# Patient Record
Sex: Male | Born: 1965 | Race: White | Hispanic: No | Marital: Single
Health system: Southern US, Community
[De-identification: ages and names within clinical notes are randomized; demographics above are authoritative.]

---

## 2020-12-20 ENCOUNTER — Ambulatory Visit (INDEPENDENT_AMBULATORY_CARE_PROVIDER_SITE_OTHER): Payer: Managed Care, Other (non HMO) | Admitting: Family Medicine

## 2020-12-20 ENCOUNTER — Ambulatory Visit: Payer: Self-pay

## 2020-12-20 ENCOUNTER — Encounter: Payer: Self-pay | Admitting: Family Medicine

## 2020-12-20 ENCOUNTER — Ambulatory Visit (HOSPITAL_BASED_OUTPATIENT_CLINIC_OR_DEPARTMENT_OTHER)
Admission: RE | Admit: 2020-12-20 | Discharge: 2020-12-20 | Disposition: A | Discharge status: Home / Self Care | Payer: Managed Care, Other (non HMO) | Source: Ambulatory Visit | Attending: Family Medicine | Admitting: Family Medicine

## 2020-12-20 ENCOUNTER — Other Ambulatory Visit: Payer: Self-pay

## 2020-12-20 VITALS — Ht 67.0 in | Wt 130.0 lb

## 2020-12-20 DIAGNOSIS — M25531 Pain in right wrist: Secondary | ICD-10-CM

## 2020-12-20 DIAGNOSIS — S6981XA Other specified injuries of right wrist, hand and finger(s), initial encounter: Secondary | ICD-10-CM | POA: Insufficient documentation

## 2020-12-20 NOTE — Patient Instructions (Signed)
Nice to meet you  Please try ice  Please try the exercises  I will call with the results from today  Please send me a message in MyChart with any questions or updates.  Please see me back in 3 weeks.   --Dr. Minor Iden  

## 2020-12-20 NOTE — Progress Notes (Signed)
  William Huber - 55 y.o. male MRN [id number]  Date of birth: April 29, 1965  SUBJECTIVE:  Including CC & ROS.  No chief complaint on file.   William Huber is a 55 y.o. male that is presenting with acute right wrist pain.  He had a fall and landed against a hyperextended wrist about 3 weeks ago.  Has trouble with his range of motion currently.  No history of similar pain.   Review of Systems See HPI   HISTORY: Past Medical, Surgical, Social, and Family History Reviewed & Updated per EMR.   Pertinent Historical Findings include:  History reviewed. No pertinent past medical history.  History reviewed. No pertinent surgical history.  History reviewed. No pertinent family history.  Social History   Socioeconomic History   Marital status: Single    Spouse name: Not on file   Number of children: Not on file   Years of education: Not on file   Highest education level: Not on file  Occupational History   Not on file  Tobacco Use   Smoking status: Not on file   Smokeless tobacco: Not on file  Substance and Sexual Activity   Alcohol use: Not on file   Drug use: Not on file   Sexual activity: Not on file  Other Topics Concern   Not on file  Social History Narrative   Not on file   Social Determinants of Health   Financial Resource Strain: Not on file  Food Insecurity: Not on file  Transportation Needs: Not on file  Physical Activity: Not on file  Stress: Not on file  Social Connections: Not on file  Intimate Partner Violence: Not on file     PHYSICAL EXAM:  VS: Ht 5\' 7"  (1.702 m)   Wt 130 lb (59 kg)   BMI 20.36 kg/m  Physical Exam Gen: NAD, alert, cooperative with exam, well-appearing  Limited ultrasound: Right wrist:  No changes of the distal radius or distal ulna. Normal-appearing dorsal compartments. Normal-appearing scaphoid. 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  Concerning for TFCC tear given his hyperextension mechanism and limited range of motion today. -Counseled on exercise therapy and supportive care. -Brace. -X-ray. -Provided work note.

## 2020-12-20 NOTE — Assessment & Plan Note (Signed)
Initial injury was 3 weeks ago.  Concerning for TFCC tear given his hyperextension mechanism and limited range of motion today. -Counseled on exercise therapy and supportive care. -Brace. -X-ray. -Provided work note.

## 2020-12-23 ENCOUNTER — Telehealth: Payer: Self-pay | Admitting: Family Medicine

## 2020-12-23 NOTE — Telephone Encounter (Signed)
Left VM for patient. If he calls back please have him speak with a nurse/CMA and inform that his xrays are normal. We will continue with current plan.   If any questions then please take the best time and phone number to call and I will try to call him back.   Myra Rude, MD [facility] 12/23/2020, 4:17 PM

## 2020-12-23 NOTE — Telephone Encounter (Signed)
Pt informed of below.  

## 2021-01-20 ENCOUNTER — Encounter: Payer: Self-pay | Admitting: Family Medicine

## 2021-01-20 ENCOUNTER — Ambulatory Visit (INDEPENDENT_AMBULATORY_CARE_PROVIDER_SITE_OTHER): Payer: Managed Care, Other (non HMO) | Admitting: Family Medicine

## 2021-01-20 DIAGNOSIS — S6981XD Other specified injuries of right wrist, hand and finger(s), subsequent encounter: Secondary | ICD-10-CM | POA: Diagnosis not present

## 2021-01-20 NOTE — Progress Notes (Signed)
  William Huber - 55 y.o. male MRN [id number]  Date of birth: Feb 19, 1966  SUBJECTIVE:  Including CC & ROS.  No chief complaint on file.   William Huber is a 55 y.o. male that is following up for his right wrist pain.  He has been using the brace at work and it helps with getting his activities done.  Denies any pain or limitations in his range of motion.   Review of Systems See HPI   HISTORY: Past Medical, Surgical, Social, and Family History Reviewed & Updated per EMR.   Pertinent Historical Findings include:  History reviewed. No pertinent past medical history.  History reviewed. No pertinent surgical history.  History reviewed. No pertinent family history.  Social History   Socioeconomic History   Marital status: Single    Spouse name: Not on file   Number of children: Not on file   Years of education: Not on file   Highest education level: Not on file  Occupational History   Not on file  Tobacco Use   Smoking status: Not on file   Smokeless tobacco: Not on file  Substance and Sexual Activity   Alcohol use: Not on file   Drug use: Not on file   Sexual activity: Not on file  Other Topics Concern   Not on file  Social History Narrative   Not on file   Social Determinants of Health   Financial Resource Strain: Not on file  Food Insecurity: Not on file  Transportation Needs: Not on file  Physical Activity: Not on file  Stress: Not on file  Social Connections: Not on file  Intimate Partner Violence: Not on file     PHYSICAL EXAM:  VS: BP (!) 168/90 (BP Location: Left Arm, Patient Position: Sitting)   Ht 5\' 7"  (1.702 m)   Wt 130 lb (59 kg)   BMI 20.36 kg/m  Physical Exam Gen: NAD, alert, cooperative with exam, well-appearing   ASSESSMENT & PLAN:   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   -Counseled on home exercise therapy and supportive care. -Provided work  note. -Could consider therapy if needed.

## 2021-01-20 NOTE — Patient Instructions (Signed)
Good to see you You can continue the brace   Please send me a message in MyChart with any questions or updates.  Please see me back as needed.   --Dr. Jordan Likes

## 2021-01-20 NOTE — Assessment & Plan Note (Signed)
Symptoms have significantly improved with bracing and restrictions.   -Counseled on home exercise therapy and supportive care. -Provided work note. -Could consider therapy if needed.

## 2021-08-16 ENCOUNTER — Telehealth: Payer: Self-pay | Admitting: Family Medicine

## 2021-08-16 ENCOUNTER — Encounter: Payer: Self-pay | Admitting: Family Medicine

## 2021-08-16 ENCOUNTER — Ambulatory Visit (INDEPENDENT_AMBULATORY_CARE_PROVIDER_SITE_OTHER): Payer: 59 | Admitting: Family Medicine

## 2021-08-16 VITALS — BP 150/80 | Ht 67.0 in | Wt 130.0 lb

## 2021-08-16 DIAGNOSIS — G5632 Lesion of radial nerve, left upper limb: Secondary | ICD-10-CM | POA: Diagnosis not present

## 2021-08-16 MED ORDER — PREDNISONE 5 MG PO TABS: ORAL_TABLET | ORAL | 0 refills | Status: AC

## 2021-08-16 NOTE — Progress Notes (Signed)
  William Huber - 56 y.o. male MRN [id number]  Date of birth: 10/01/1965  SUBJECTIVE:  Including CC & ROS.  No chief complaint on file.   William Huber is a 56 y.o. male that is presenting with left wrist weakness.  The weakness has been occurring for 2 weeks.  He is unable to extend his wrist or his fingers.  His hand just flails.  No injury or inciting event.  No new or different activities.  No surgery in the upper arm or chest.   Review of Systems See HPI   HISTORY: Past Medical, Surgical, Social, and Family History Reviewed & Updated per EMR.   Pertinent Historical Findings include:  History reviewed. No pertinent past medical history.  History reviewed. No pertinent surgical history.   PHYSICAL EXAM:  VS: BP (!) 150/80 (BP Location: Left Arm, Patient Position: Sitting)   Ht 5\' 7"  (1.702 m)   Wt 130 lb (59 kg)   BMI 20.36 kg/m  Physical Exam Gen: NAD, alert, cooperative with exam, well-appearing MSK:  Neurovascularly intact       ASSESSMENT & PLAN:   Radial nerve palsy, left Acutely occurring.  Presenting with wrist drop with inability to extend the wrist or the fingers.  No history of surgery.  No recent injury or inciting event. -Counseled on home exercise therapy and supportive care. -Provided wrist brace. -Provided work note. -Prednisone. -May need to consider nerve study.

## 2021-08-16 NOTE — Telephone Encounter (Signed)
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  --Work restriction note states " No use of L wrist/hand.  Employer's  (Light Duty) allow up to 15lbs they need to know if pt is ( Absolutely no L wrist /hand use) or if (he can do their designated light duty) please verify. New or amending Wk note to be faxed to either Reuel Boom or [place] @ [employer].

## 2021-08-16 NOTE — Assessment & Plan Note (Signed)
Acutely occurring.  Presenting with wrist drop with inability to extend the wrist or the fingers.  No history of surgery.  No recent injury or inciting event. -Counseled on home exercise therapy and supportive care. -Provided wrist brace. -Provided work note. -Prednisone. -May need to consider nerve study.

## 2021-08-16 NOTE — Patient Instructions (Signed)
Good to see you Please use the brace  Please try the medicine   Please send me a message in MyChart with any questions or updates.  Please see me back in 3 weeks.   --Dr. Jordan Likes

## 2021-08-17 ENCOUNTER — Encounter: Payer: Self-pay | Admitting: Family Medicine

## 2021-08-17 NOTE — Telephone Encounter (Signed)
Provided updated work note.   Myra Rude, MD [facility] 08/17/2021, 7:46 AM

## 2021-09-06 ENCOUNTER — Ambulatory Visit (INDEPENDENT_AMBULATORY_CARE_PROVIDER_SITE_OTHER): Payer: 59 | Admitting: Family Medicine

## 2021-09-06 ENCOUNTER — Encounter: Payer: Self-pay | Admitting: Family Medicine

## 2021-09-06 VITALS — Ht 67.0 in | Wt 130.0 lb

## 2021-09-06 DIAGNOSIS — G5632 Lesion of radial nerve, left upper limb: Secondary | ICD-10-CM | POA: Diagnosis not present

## 2021-10-11 ENCOUNTER — Ambulatory Visit (INDEPENDENT_AMBULATORY_CARE_PROVIDER_SITE_OTHER): Payer: 59 | Admitting: Family Medicine

## 2021-10-11 DIAGNOSIS — G5632 Lesion of radial nerve, left upper limb: Secondary | ICD-10-CM

## 2021-10-11 NOTE — Progress Notes (Signed)
  William Huber - 56 y.o. male MRN [id number]  Date of birth: 11/16/1965  SUBJECTIVE:  Including CC & ROS.  No chief complaint on file.   William Huber is a 56 y.o. male that is following up for his left radial nerve palsy.  He continues to gain strength and improvement in his range of motion.  Denies any pain.  He has been using the wrist brace intermittently.    Review of Systems See HPI   HISTORY: Past Medical, Surgical, Social, and Family History Reviewed & Updated per EMR.   Pertinent Historical Findings include:  No past medical history on file.  No past surgical history on file.   PHYSICAL EXAM:  VS: Ht 5\' 7"  (1.702 m)   Wt 130 lb (59 kg)   BMI 20.36 kg/m  Physical Exam Gen: NAD, alert, cooperative with exam, well-appearing MSK:  Neurovascularly intact       ASSESSMENT & PLAN:   Radial nerve palsy, left Appears near normal with his strength and function today. -Counseled on home exercise therapy and supportive care. -Provided work note. -Could consider nerve study.

## 2021-10-11 NOTE — Patient Instructions (Signed)
Good to see you Please continue the exercises  Please let me know if your strength doesn't return to normal  Please send me a message in MyChart with any questions or updates.  Please see me back as needed.   --Dr. Jordan Likes

## 2021-10-11 NOTE — Assessment & Plan Note (Signed)
Appears near normal with his strength and function today. -Counseled on home exercise therapy and supportive care. -Provided work note. -Could consider nerve study.

## 2022-06-26 ENCOUNTER — Encounter: Payer: Self-pay | Admitting: *Deleted

## 2022-10-08 IMAGING — DX DG WRIST COMPLETE 3+V*R*
4 series · 4 of 4 positions shown · non-contrast
Comparison: No prior.

CLINICAL DATA: Injury.  Right wrist pain.

EXAM:
RIGHT WRIST - COMPLETE 3+ VIEW

[wrist pa]
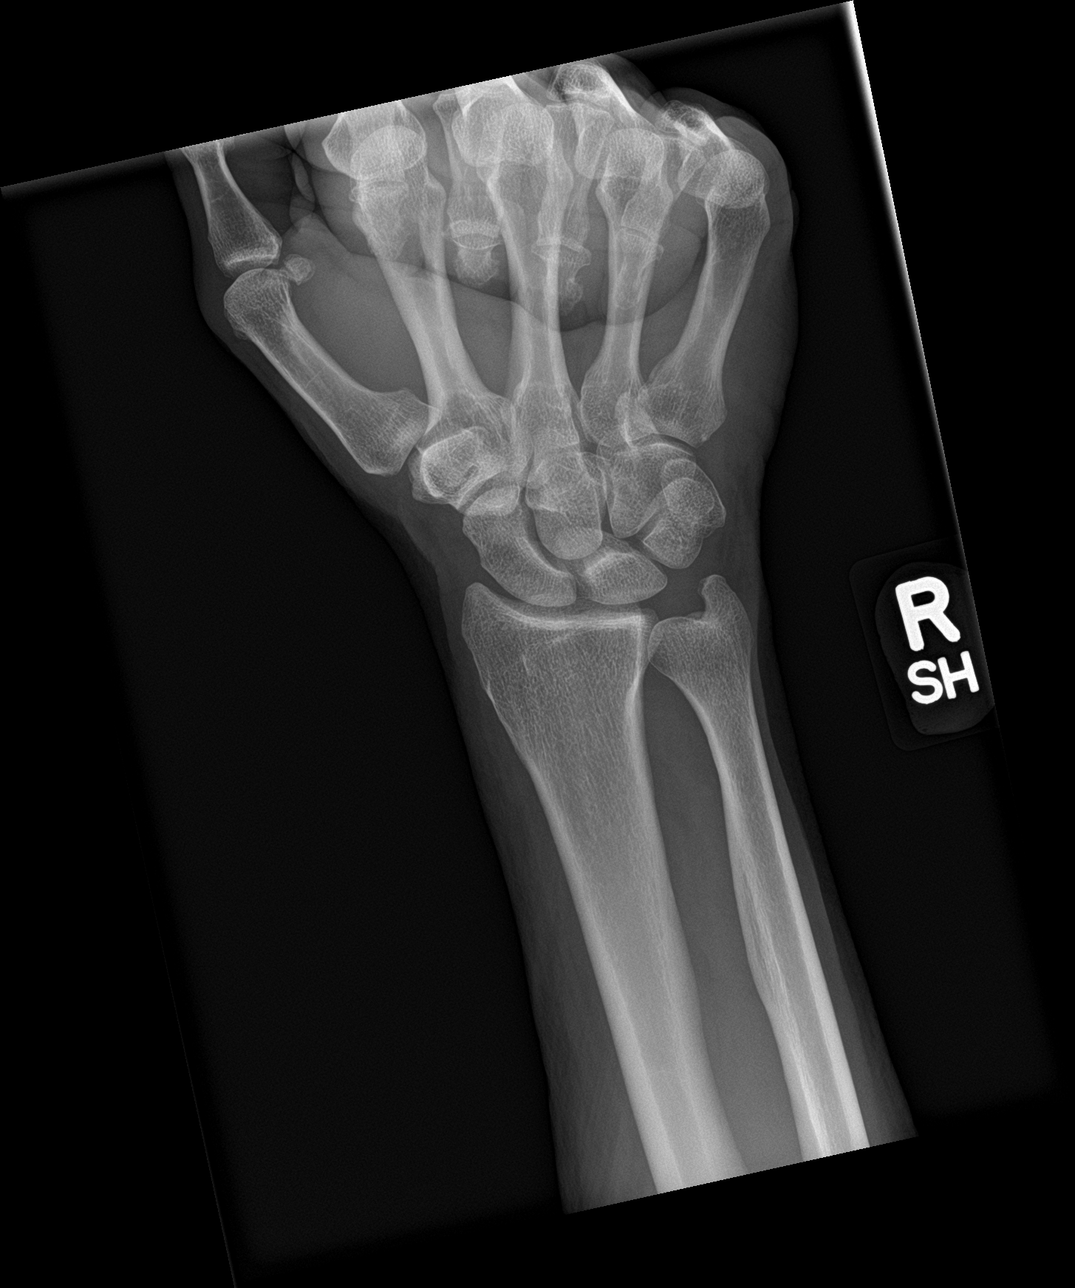

[wrist obl]
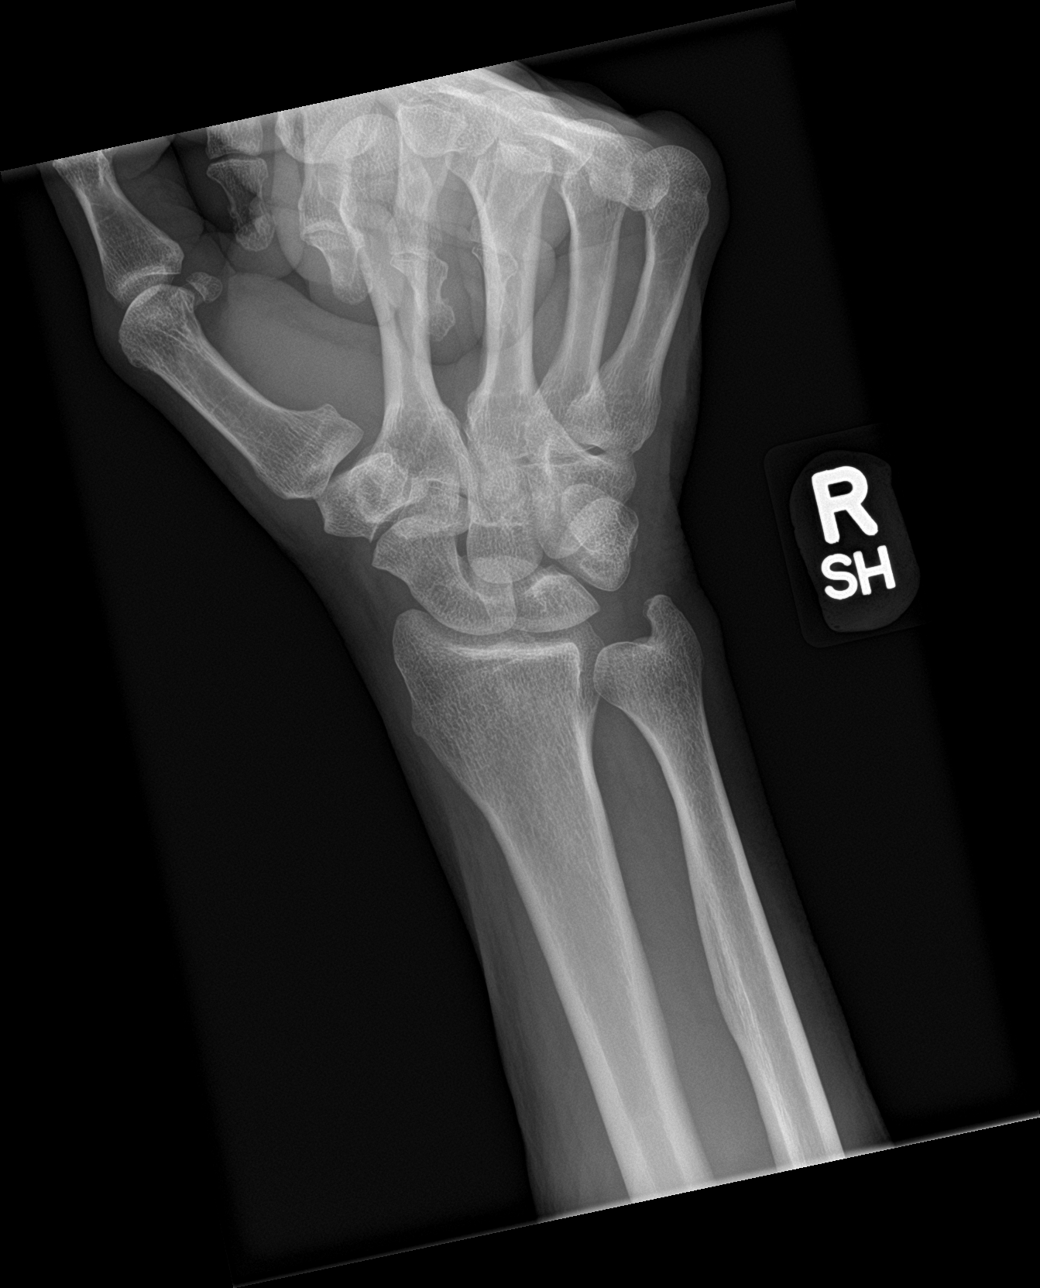

[wrist lat]
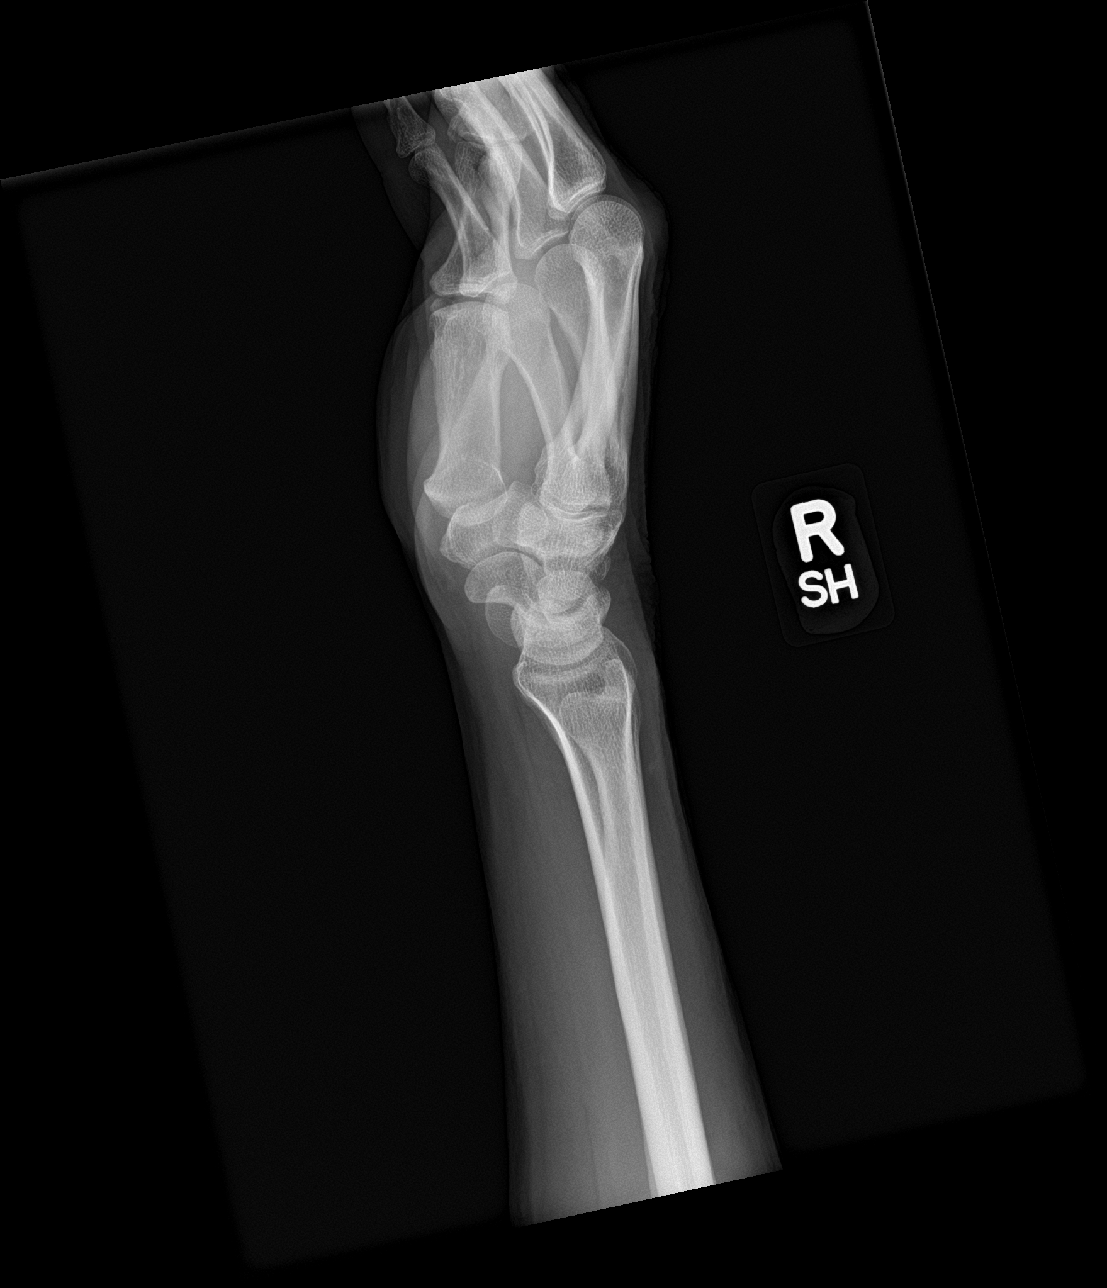

[wrist navicular]
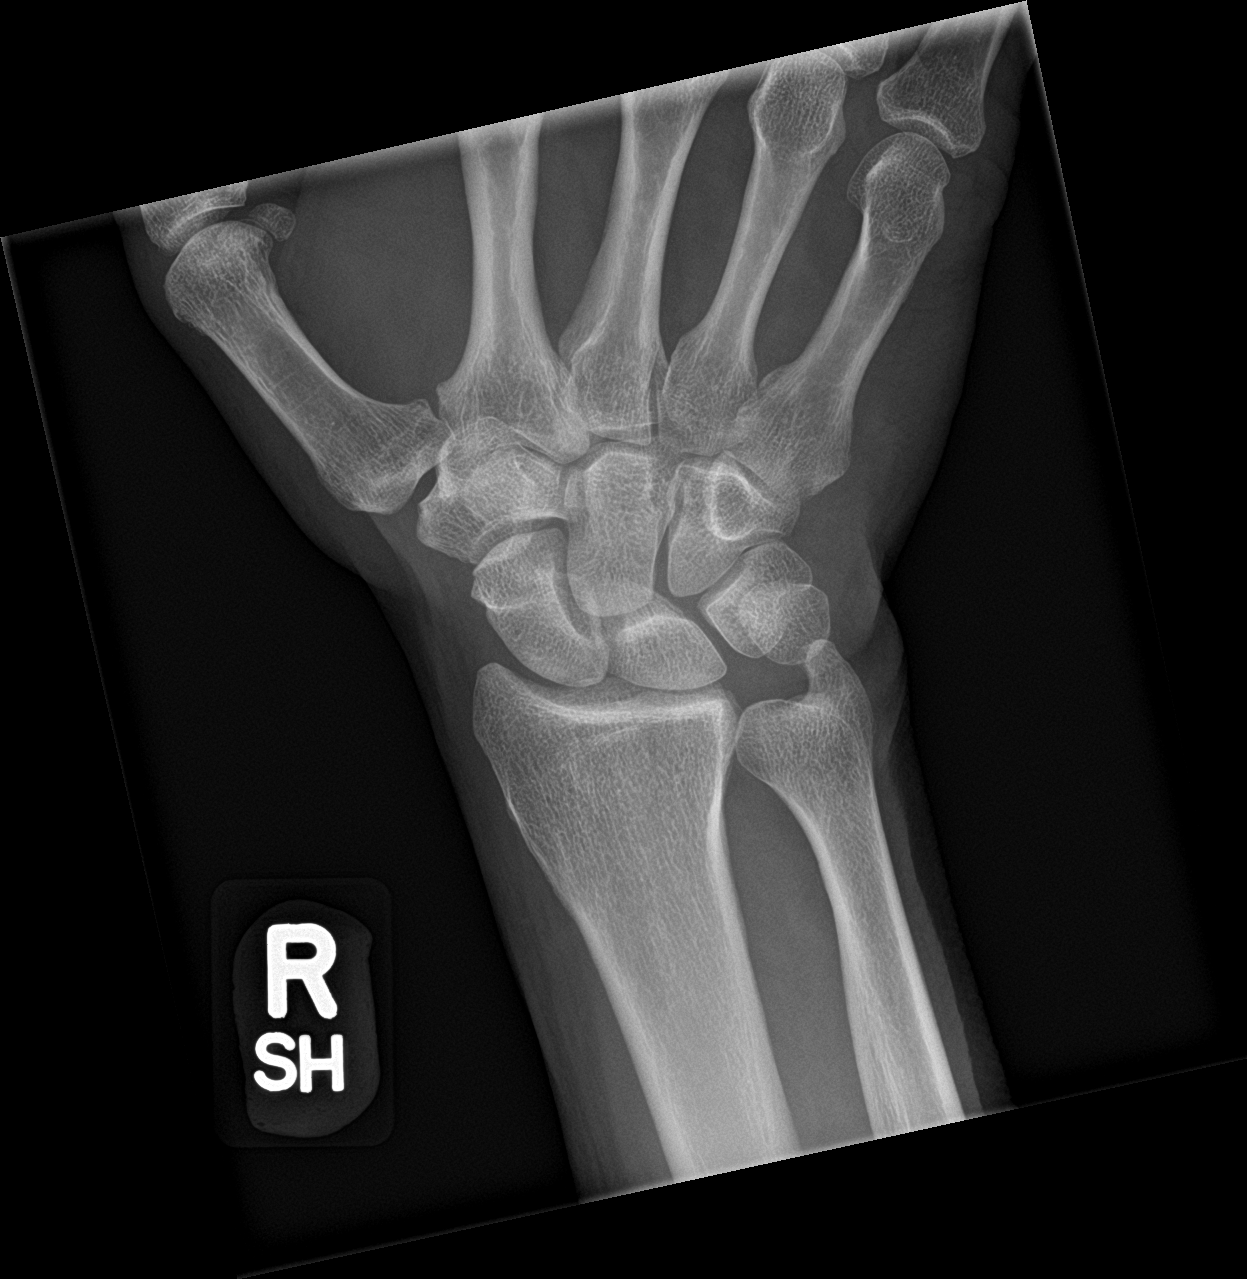

[4 of 4 positions shown; findings below may reference images not displayed]

FINDINGS: No acute soft tissue bony abnormality. No evidence of fracture or
dislocation.
IMPRESSION: No acute abnormality.
